# Patient Record
Sex: Male | Born: 2005 | Hispanic: No | Marital: Single
Health system: Southern US, Community
[De-identification: ages and names within clinical notes are randomized; demographics above are authoritative.]

---
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
## 2023-01-05 ENCOUNTER — Ambulatory Visit (HOSPITAL_COMMUNITY): Payer: Managed Care, Other (non HMO)

## 2023-01-05 ENCOUNTER — Ambulatory Visit: Payer: Managed Care, Other (non HMO) | Admitting: Family Medicine

## 2023-01-05 ENCOUNTER — Ambulatory Visit: Payer: Managed Care, Other (non HMO)

## 2023-01-05 ENCOUNTER — Ambulatory Visit: Payer: Self-pay

## 2023-01-05 VITALS — BP 138/68 | HR 72 | Ht 72.0 in | Wt 195.0 lb

## 2023-01-05 DIAGNOSIS — M25551 Pain in right hip: Secondary | ICD-10-CM

## 2023-01-05 NOTE — Progress Notes (Signed)
   I, Peterson Lombard, LAT, ATC acting as a scribe for Lynne Leader, MD.  Subjective:    CC: R Hip pain  HPI: Pt is an 17 y/o male c/o hip pain ongoing since Saturday. MOI: Pt was playing in a basketball game at the [facility] and felt like a "shift" in his R hip. Pt locates pain to the lateral aspect of the right and sometime if exacerbated will shoot down the lateral thigh.   Aggravates: running, hip flex, aBd Treatments tried: IBU, ice heat  Pertinent review of Systems: No fevers or chills  Relevant historical information: Otherwise healthy.  Patient is a high school senior   Objective:    Vitals:   01/05/23 1350  BP: 138/68  Pulse: 72  SpO2: 98%   General: Well Developed, well nourished, and in no acute distress.   MSK: Right hip: Normal-appearing Normal motion. Tender palpation greater trochanter and along the distal course of the gluteus medius and piriformis tendons and muscle groups. Hip abduction and external rotation strength are diminished and painful 4/5.  Lab and Radiology Results  Diagnostic Limited MSK Ultrasound of: Right lateral hip No definitive large tear visible on ultrasound of the hip rotators and abductors.  Normal appearance of greater trochanter Impression: No visible large tear hip rotators and abductor  X-ray images right hip obtained today personally and independently interpreted No acute fractures.  No significant degenerative changes. Await formal radiology review   Impression and Recommendations:    Assessment and Plan: 17 y.o. male with right lateral hip pain occurring suddenly while playing basketball.  He felt a shifting sensation in the lateral hip.  I think it is likely that he does have a strain or partial tear of the hip abductors or rotators.  Plan for physical therapy referral.  He lives in [place].  Will refer to [facility] PT in [place].  Check back in 6 weeks unless better.Marland Kitchen  PDMP not reviewed this encounter. Orders Placed  This Encounter  Procedures   DG HIP UNILAT W OR W/O PELVIS 2-3 VIEWS RIGHT    Standing Status:   Future    Number of Occurrences:   1    Standing Expiration Date:   02/05/2023    Order Specific Question:   Reason for Exam (SYMPTOM  OR DIAGNOSIS REQUIRED)    Answer:   right hip pain    Order Specific Question:   Preferred imaging location?    Answer:   [facility]   [place] LIMITED JOINT SPACE STRUCTURES UP RIGHT(NO LINKED CHARGES)    Order Specific Question:   Reason for Exam (SYMPTOM  OR DIAGNOSIS REQUIRED)    Answer:   rt lat hip    Order Specific Question:   Preferred imaging location?    Answer:   [facility]   Ambulatory referral to Physical Therapy    Referral Priority:   Routine    Referral Type:   Physical Medicine    Referral Reason:   Specialty Services Required    Requested Specialty:   Physical Therapy    Number of Visits Requested:   1   No orders of the defined types were placed in this encounter.   Discussed warning signs or symptoms. Please see discharge instructions. Patient expresses understanding.   The above documentation has been reviewed and is accurate and complete Lynne Leader, M.D.

## 2023-01-05 NOTE — Patient Instructions (Signed)
Thank you for coming in today.   Plan for PT.   Please get an Xray today before you leave   Take it easy with sports until feeling better.   Let me know if you have a problem.   Recheck in 6 weeks.   If you are better ok to cancel the visit.

## 2023-01-06 NOTE — Progress Notes (Signed)
No fractures are seen.  No arthritis seen.   The way the hip is shaped is a little concerning for potential hip impingement.  I do not think this is causing your pain but could cause a problem in your future.  Again I think this is unrelated.  I am going to ask an orthopedic surgeon in the community who specializes in this kind of surgery his opinion.
